# Patient Record
Sex: Male | Born: 1980 | Race: Black or African American | Hispanic: No | Marital: Married | Smoking: Never smoker
Health system: Southern US, Community
[De-identification: ages and names within clinical notes are randomized; demographics above are authoritative.]

## PROBLEM LIST (undated history)

## (undated) DIAGNOSIS — I82409 Acute embolism and thrombosis of unspecified deep veins of unspecified lower extremity: Secondary | ICD-10-CM

## (undated) HISTORY — DX: Acute embolism and thrombosis of unspecified deep veins of unspecified lower extremity: I82.409

---

## 2016-03-11 DIAGNOSIS — I82409 Acute embolism and thrombosis of unspecified deep veins of unspecified lower extremity: Secondary | ICD-10-CM

## 2016-03-11 HISTORY — DX: Acute embolism and thrombosis of unspecified deep veins of unspecified lower extremity: I82.409

## 2017-05-08 ENCOUNTER — Encounter: Payer: Self-pay | Admitting: Hematology & Oncology

## 2017-05-08 ENCOUNTER — Inpatient Hospital Stay (HOSPITAL_BASED_OUTPATIENT_CLINIC_OR_DEPARTMENT_OTHER): Payer: Medicaid Other | Admitting: Hematology & Oncology

## 2017-05-08 ENCOUNTER — Inpatient Hospital Stay: Payer: Medicaid Other | Attending: Hematology & Oncology

## 2017-05-08 VITALS — BP 122/88 | HR 78 | Temp 98.1°F | Wt 287.5 lb

## 2017-05-08 DIAGNOSIS — I82491 Acute embolism and thrombosis of other specified deep vein of right lower extremity: Secondary | ICD-10-CM

## 2017-05-08 DIAGNOSIS — Z7901 Long term (current) use of anticoagulants: Secondary | ICD-10-CM | POA: Insufficient documentation

## 2017-05-08 DIAGNOSIS — I2692 Saddle embolus of pulmonary artery without acute cor pulmonale: Secondary | ICD-10-CM

## 2017-05-08 LAB — CBC WITH DIFFERENTIAL (CANCER CENTER ONLY)
BASOS ABS: 0 10*3/uL (ref 0.0–0.1)
BASOS PCT: 0 %
EOS ABS: 0.1 10*3/uL (ref 0.0–0.5)
EOS PCT: 2 %
HCT: 44.3 % (ref 38.7–49.9)
Hemoglobin: 15.1 g/dL (ref 13.0–17.1)
Lymphocytes Relative: 55 %
Lymphs Abs: 2.5 10*3/uL (ref 0.9–3.3)
MCH: 28.6 pg (ref 28.0–33.4)
MCHC: 34.1 g/dL (ref 32.0–35.9)
MCV: 83.9 fL (ref 82.0–98.0)
Monocytes Absolute: 0.4 10*3/uL (ref 0.1–0.9)
Monocytes Relative: 9 %
NEUTROS PCT: 34 %
Neutro Abs: 1.6 10*3/uL (ref 1.5–6.5)
PLATELETS: 302 10*3/uL (ref 145–400)
RBC: 5.28 MIL/uL (ref 4.20–5.70)
RDW: 13.6 % (ref 11.1–15.7)
WBC: 4.6 10*3/uL (ref 4.0–10.0)

## 2017-05-08 LAB — CMP (CANCER CENTER ONLY)
ALT: 39 U/L (ref 0–55)
AST: 25 U/L (ref 5–34)
Albumin: 4.1 g/dL (ref 3.5–5.0)
Alkaline Phosphatase: 92 U/L (ref 40–150)
Anion gap: 10 (ref 3–11)
BUN: 14 mg/dL (ref 7–26)
CO2: 28 mmol/L (ref 22–29)
CREATININE: 1.3 mg/dL (ref 0.70–1.30)
Calcium: 10.2 mg/dL (ref 8.4–10.4)
Chloride: 102 mmol/L (ref 98–109)
Glucose, Bld: 83 mg/dL (ref 70–140)
Potassium: 4.6 mmol/L (ref 3.5–5.1)
Sodium: 140 mmol/L (ref 136–145)
TOTAL PROTEIN: 8.4 g/dL — AB (ref 6.4–8.3)
Total Bilirubin: 1.1 mg/dL (ref 0.2–1.2)
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
## 2017-05-09 ENCOUNTER — Telehealth: Payer: Self-pay | Admitting: Hematology & Oncology

## 2017-05-09 LAB — BETA-2-GLYCOPROTEIN I ABS, IGG/M/A: Beta-2 Glyco I IgG: <9 GPI IgG units (ref 0–20)

## 2017-05-09 LAB — HOMOCYSTEINE: HOMOCYSTEINE-NORM: 11.4 umol/L (ref 0.0–15.0)

## 2017-05-09 NOTE — Telephone Encounter (Signed)
sw pt to confirm 5/15 appt at 10 am for imaging and 1130 to see Dr Myna HidalgoEnnever

## 2017-05-10 LAB — CARDIOLIPIN ANTIBODIES, IGG, IGM, IGA
Anticardiolipin IgG: 12 GPL U/mL (ref 0–14)
Anticardiolipin IgM: <9 MPL U/mL (ref 0–12)

## 2017-05-10 LAB — PROTEIN S, TOTAL: Protein S Ag, Total: 86 % (ref 60–150)

## 2017-05-10 LAB — PROTEIN C ACTIVITY: PROTEIN C ACTIVITY: 173 % (ref 73–180)

## 2017-05-10 LAB — PROTEIN S ACTIVITY: Protein S Activity: 92 % (ref 63–140)

## 2017-05-11 LAB — PTT-LA MIX: PTT-LA Mix: 52.7 s — ABNORMAL HIGH (ref 0.0–48.9)

## 2017-05-11 LAB — DRVVT MIX: DRVVT MIX: 44.5 s (ref 0.0–47.0)

## 2017-05-11 LAB — LUPUS ANTICOAGULANT PANEL
DRVVT: 57.8 s — ABNORMAL HIGH (ref 0.0–47.0)
PTT LA: 57.6 s — AB (ref 0.0–51.9)

## 2017-05-11 LAB — HEXAGONAL PHASE PHOSPHOLIPID: Hexagonal Phase Phospholipid: 30 s — ABNORMAL HIGH (ref 0–11)

## 2017-05-12 LAB — PROTHROMBIN GENE MUTATION

## 2017-05-13 LAB — FACTOR 5 LEIDEN

## 2017-05-30 ENCOUNTER — Other Ambulatory Visit: Payer: Self-pay | Admitting: Internal Medicine

## 2017-05-30 ENCOUNTER — Ambulatory Visit
Admission: RE | Admit: 2017-05-30 | Discharge: 2017-05-30 | Disposition: A | Discharge status: Home / Self Care | Payer: Medicaid Other | Source: Ambulatory Visit | Attending: Internal Medicine | Admitting: Internal Medicine

## 2017-05-30 DIAGNOSIS — M545 Low back pain: Secondary | ICD-10-CM

## 2017-06-06 ENCOUNTER — Other Ambulatory Visit: Payer: Self-pay | Admitting: Internal Medicine

## 2017-06-06 DIAGNOSIS — M5432 Sciatica, left side: Secondary | ICD-10-CM

## 2017-06-10 ENCOUNTER — Ambulatory Visit
Admission: RE | Admit: 2017-06-10 | Discharge: 2017-06-10 | Disposition: A | Discharge status: Home / Self Care | Payer: Medicaid Other | Source: Ambulatory Visit | Attending: Internal Medicine | Admitting: Internal Medicine

## 2017-06-10 DIAGNOSIS — M5432 Sciatica, left side: Secondary | ICD-10-CM

## 2017-06-16 ENCOUNTER — Telehealth: Payer: Self-pay | Admitting: *Deleted

## 2017-06-16 ENCOUNTER — Inpatient Hospital Stay: Payer: Medicaid Other | Attending: Hematology & Oncology | Admitting: Hematology & Oncology

## 2017-06-16 ENCOUNTER — Inpatient Hospital Stay: Payer: Medicaid Other

## 2017-06-16 NOTE — Telephone Encounter (Signed)
Called patient to see why he missed his appointment today upon request from Dr. Myna HidalgoEnnever.  Patient stated that he has decided not to proceed with Neurosurgery at this time and would like for me to let Dr. Myna HidalgoEnnever know.

## 2017-07-23 ENCOUNTER — Encounter (HOSPITAL_BASED_OUTPATIENT_CLINIC_OR_DEPARTMENT_OTHER): Payer: Self-pay

## 2017-07-23 ENCOUNTER — Encounter: Payer: Self-pay | Admitting: Hematology & Oncology

## 2017-07-23 ENCOUNTER — Other Ambulatory Visit: Payer: Self-pay

## 2017-07-23 ENCOUNTER — Ambulatory Visit (HOSPITAL_BASED_OUTPATIENT_CLINIC_OR_DEPARTMENT_OTHER)
Admission: RE | Admit: 2017-07-23 | Discharge: 2017-07-23 | Disposition: A | Discharge status: Home / Self Care | Payer: Medicaid Other | Source: Ambulatory Visit | Attending: Hematology & Oncology | Admitting: Hematology & Oncology

## 2017-07-23 ENCOUNTER — Inpatient Hospital Stay (HOSPITAL_BASED_OUTPATIENT_CLINIC_OR_DEPARTMENT_OTHER): Payer: Medicaid Other | Admitting: Hematology & Oncology

## 2017-07-23 ENCOUNTER — Inpatient Hospital Stay: Payer: Medicaid Other | Attending: Hematology & Oncology

## 2017-07-23 VITALS — BP 128/89 | HR 65 | Temp 98.1°F | Resp 20 | Wt 285.0 lb

## 2017-07-23 DIAGNOSIS — Z7901 Long term (current) use of anticoagulants: Secondary | ICD-10-CM

## 2017-07-23 DIAGNOSIS — I82441 Acute embolism and thrombosis of right tibial vein: Secondary | ICD-10-CM | POA: Insufficient documentation

## 2017-07-23 DIAGNOSIS — I2692 Saddle embolus of pulmonary artery without acute cor pulmonale: Secondary | ICD-10-CM

## 2017-07-23 DIAGNOSIS — R918 Other nonspecific abnormal finding of lung field: Secondary | ICD-10-CM | POA: Insufficient documentation

## 2017-07-23 DIAGNOSIS — D6862 Lupus anticoagulant syndrome: Secondary | ICD-10-CM | POA: Insufficient documentation

## 2017-07-23 DIAGNOSIS — I2699 Other pulmonary embolism without acute cor pulmonale: Secondary | ICD-10-CM | POA: Diagnosis not present

## 2017-07-23 DIAGNOSIS — R937 Abnormal findings on diagnostic imaging of other parts of musculoskeletal system: Secondary | ICD-10-CM | POA: Insufficient documentation

## 2017-07-23 LAB — CMP (CANCER CENTER ONLY)
ALBUMIN: 4 g/dL (ref 3.5–5.0)
ALK PHOS: 81 U/L (ref 26–84)
ALT: 53 U/L — AB (ref 10–47)
AST: 34 U/L (ref 11–38)
Anion gap: 10 (ref 5–15)
BUN: 12 mg/dL (ref 7–22)
CALCIUM: 9.8 mg/dL (ref 8.0–10.3)
CO2: 29 mmol/L (ref 18–33)
CREATININE: 1.4 mg/dL — AB (ref 0.60–1.20)
Chloride: 103 mmol/L (ref 98–108)
Glucose, Bld: 89 mg/dL (ref 73–118)
Potassium: 4.1 mmol/L (ref 3.3–4.7)
SODIUM: 142 mmol/L (ref 128–145)
TOTAL PROTEIN: 8.2 g/dL — AB (ref 6.4–8.1)
Total Bilirubin: 1.1 mg/dL (ref 0.2–1.6)

## 2017-07-23 LAB — CBC WITH DIFFERENTIAL (CANCER CENTER ONLY)
BASOS ABS: 0 10*3/uL (ref 0.0–0.1)
Basophils Relative: 1 %
EOS ABS: 0.1 10*3/uL (ref 0.0–0.5)
EOS PCT: 2 %
HCT: 43.1 % (ref 38.7–49.9)
Hemoglobin: 14.7 g/dL (ref 13.0–17.1)
Lymphocytes Relative: 42 %
Lymphs Abs: 1.9 10*3/uL (ref 0.9–3.3)
MCH: 28.5 pg (ref 28.0–33.4)
MCHC: 34.1 g/dL (ref 32.0–35.9)
MCV: 83.7 fL (ref 82.0–98.0)
Monocytes Absolute: 0.4 10*3/uL (ref 0.1–0.9)
Monocytes Relative: 9 %
Neutro Abs: 2 10*3/uL (ref 1.5–6.5)
Neutrophils Relative %: 46 %
PLATELETS: 296 10*3/uL (ref 145–400)
RBC: 5.15 MIL/uL (ref 4.20–5.70)
RDW: 13.8 % (ref 11.1–15.7)
WBC: 4.4 10*3/uL (ref 4.0–10.0)

## 2017-07-23 MED ORDER — IOPAMIDOL (ISOVUE-370) INJECTION 76%
100.0000 mL | Freq: Once | INTRAVENOUS | Status: AC | PRN
  Administered 2017-07-23: 100 mL via INTRAVENOUS

## 2017-07-23 NOTE — Progress Notes (Signed)
Hematology and Oncology Follow Up Visit  Erik Kim [id number] Dec 15, 1980 37 y.o. 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 twice daily -started in June 2018     Interim History:  Erik Kim is back for follow-up.  We first saw him in late February.  At that time, he had been on Eliquis for quite a while.  He had his blood clots back in June 2018.  We did a hypercoagulable study.  Of course, the study showed a positive lupus anticoagulant.  I am not sure exactly what this signifies.  All of his other studies were negative.  He feels okay.  He is a Optician, dispensing.  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  The big news is that to his daughters might be chosen for the [employer] channel.  They have to go down to [employer] for [employer].  We did do a CT angiogram today.  This did not show any residual pulmonary embolism.  We did a Doppler of his right leg.  There is no thrombus noted in the lower leg.  He has had no bleeding.  He has had no nausea or vomiting.  He has had no headache.  He is gaining weight.  He is trying to lose weight.  Overall, his performance status is ECOG 0.   Medications:  Current Outpatient Medications:  .  apixaban (ELIQUIS) 5 MG TABS tablet, Take 5 mg by mouth 2 (two) times daily., Disp: , Rfl:   Allergies: No Known Allergies  Past Medical History, Surgical history, Social history, and Family History were reviewed and updated.  Review of Systems: Review of Systems  Constitutional: Negative.   HENT:  Negative.   Eyes: Negative.   Respiratory: Negative.   Cardiovascular: Negative.   Gastrointestinal: Negative.   Endocrine: Negative.   Genitourinary: Negative.    Musculoskeletal: Negative.   Skin: Negative.   Neurological: Negative.   Hematological: Negative.     Psychiatric/Behavioral: Negative.     Physical Exam:  weight is 285 lb (129.3 kg). His oral temperature is 98.1 F (36.7 C). His blood pressure is 128/89 and his pulse is 65. His respiration is 20 and oxygen saturation is 98%.   Wt Readings from Last 3 Encounters:  07/23/17 285 lb (129.3 kg)  05/08/17 287 lb 8 oz (130.4 kg)    Physical Exam  Constitutional: He is oriented to person, place, and time.  HENT:  Head: Normocephalic and atraumatic.  Mouth/Throat: Oropharynx is clear and moist.  Eyes: Pupils are equal, round, and reactive to light. EOM are normal.  Neck: Normal range of motion.  Cardiovascular: Normal rate, regular rhythm and normal heart sounds.  Pulmonary/Chest: Effort normal and breath sounds normal.  Abdominal: Soft. Bowel sounds are normal.  Musculoskeletal: Normal range of motion. He exhibits no edema, tenderness or deformity.  Lymphadenopathy:    He has no cervical adenopathy.  Neurological: He is alert and oriented to person, place, and time.  Skin: Skin is warm and dry. No rash noted. No erythema.  Psychiatric: He has a normal mood and affect. His behavior is normal. Judgment and thought content normal.  Vitals reviewed.    Lab Results  Component Value Date   WBC 4.4 07/23/2017   HGB 14.7 07/23/2017   HCT 43.1 07/23/2017   MCV 83.7 07/23/2017   PLT 296  07/23/2017     Chemistry      Component Value Date/Time   NA 142 07/23/2017 1119   K 4.1 07/23/2017 1119   CL 103 07/23/2017 1119   CO2 29 07/23/2017 1119   BUN 12 07/23/2017 1119   CREATININE 1.40 (H) 07/23/2017 1119      Component Value Date/Time   CALCIUM 9.8 07/23/2017 1119   ALKPHOS 81 07/23/2017 1119   AST 34 07/23/2017 1119   ALT 53 (H) 07/23/2017 1119   BILITOT 1.1 07/23/2017 1119      Impression and Plan: Erik Kim is a 37 year old African-American male.  He had a pulmonary embolism.  He had a thrombus in his right lower leg.  These have resolved.  He is on Eliquis.  He is on  full dose Eliquis.  I will continue him on the Eliquis for right now.  We will see what his follow-up lupus anticoagulant study shows.  I want to try to get him through the summertime.  He will be quite busy.  I will see him back in 4 months.  As always, it is a lot of fun talking with him.  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   Josph Macho, MD 5/15/20196:17 PM

## 2017-11-27 ENCOUNTER — Inpatient Hospital Stay: Payer: Medicaid Other

## 2017-11-27 ENCOUNTER — Inpatient Hospital Stay: Payer: Medicaid Other | Attending: Hematology & Oncology | Admitting: Hematology & Oncology

## 2017-11-27 ENCOUNTER — Encounter: Payer: Self-pay | Admitting: Hematology & Oncology

## 2017-11-27 ENCOUNTER — Other Ambulatory Visit: Payer: Self-pay

## 2017-11-27 VITALS — BP 120/75 | HR 80 | Temp 98.3°F | Resp 18 | Wt 279.0 lb

## 2017-11-27 DIAGNOSIS — Z7901 Long term (current) use of anticoagulants: Secondary | ICD-10-CM

## 2017-11-27 DIAGNOSIS — I2692 Saddle embolus of pulmonary artery without acute cor pulmonale: Secondary | ICD-10-CM

## 2017-11-27 DIAGNOSIS — Z86711 Personal history of pulmonary embolism: Secondary | ICD-10-CM | POA: Insufficient documentation

## 2017-11-27 DIAGNOSIS — D6862 Lupus anticoagulant syndrome: Secondary | ICD-10-CM | POA: Insufficient documentation

## 2017-11-27 DIAGNOSIS — Z86718 Personal history of other venous thrombosis and embolism: Secondary | ICD-10-CM | POA: Diagnosis not present

## 2017-11-27 LAB — CMP (CANCER CENTER ONLY)
ALT: 32 U/L (ref 0–44)
AST: 26 U/L (ref 15–41)
Albumin: 4.3 g/dL (ref 3.5–5.0)
Alkaline Phosphatase: 83 U/L (ref 38–126)
Anion gap: 8 (ref 5–15)
BUN: 12 mg/dL (ref 6–20)
CHLORIDE: 106 mmol/L (ref 98–111)
CO2: 26 mmol/L (ref 22–32)
CREATININE: 1.42 mg/dL — AB (ref 0.61–1.24)
Calcium: 10 mg/dL (ref 8.9–10.3)
Glucose, Bld: 111 mg/dL — ABNORMAL HIGH (ref 70–99)
POTASSIUM: 4.1 mmol/L (ref 3.5–5.1)
SODIUM: 140 mmol/L (ref 135–145)
Total Bilirubin: 0.8 mg/dL (ref 0.3–1.2)
Total Protein: 8.2 g/dL — ABNORMAL HIGH (ref 6.5–8.1)

## 2017-11-27 LAB — CBC WITH DIFFERENTIAL (CANCER CENTER ONLY)
BASOS ABS: 0 10*3/uL (ref 0.0–0.1)
Basophils Relative: 1 %
EOS PCT: 2 %
Eosinophils Absolute: 0.1 10*3/uL (ref 0.0–0.5)
HCT: 42.9 % (ref 38.7–49.9)
Hemoglobin: 14.4 g/dL (ref 13.0–17.1)
LYMPHS ABS: 2.5 10*3/uL (ref 0.9–3.3)
LYMPHS PCT: 55 %
MCH: 28.1 pg (ref 28.0–33.4)
MCHC: 33.6 g/dL (ref 32.0–35.9)
MCV: 83.6 fL (ref 82.0–98.0)
MONO ABS: 0.3 10*3/uL (ref 0.1–0.9)
Monocytes Relative: 6 %
NEUTROS ABS: 1.6 10*3/uL (ref 1.5–6.5)
Neutrophils Relative %: 36 %
PLATELETS: 307 10*3/uL (ref 145–400)
RBC: 5.13 MIL/uL (ref 4.20–5.70)
RDW: 13.5 % (ref 11.1–15.7)
WBC Count: 4.4 10*3/uL (ref 4.0–10.0)

## 2017-11-27 MED ORDER — APIXABAN 2.5 MG PO TABS: 2.5000 mg | ORAL_TABLET | Freq: Two times a day (BID) | ORAL | 12 refills | Status: DC

## 2017-11-27 NOTE — Progress Notes (Signed)
Hematology and Oncology Follow Up Visit  Erik Kim [id number] March 17, 1980 37 y.o. 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 twice daily -started in June 2018  Eliquis 2.5 po BID - maintanence -- start 11/2017     Interim History:  Erik Kim is back for follow-up.  It has been a while since we last saw him.  At the last time we saw him was back in May.  He has been busy this summer.  He is remodeling a house.  He is doing this in addition to [employer] in his church.  He is on Eliquis.  He is doing well on Eliquis.  We will switch him over to maintenance Eliquis.  I think this would be reasonable.  His first visit we found that he had a lupus anticoagulant.  We are checking this again.  If he has a positive lupus anticoagulant then we might have to consider low-dose aspirin.  His appetite is good.  He is trying to lose a little weight.  Overall, his performance status is ECOG 0.    Medications:  Current Outpatient Medications:  .  apixaban (ELIQUIS) 5 MG TABS tablet, Take 5 mg by mouth 2 (two) times daily., Disp: , Rfl:   Allergies: No Known Allergies  Past Medical History, Surgical history, Social history, and Family History were reviewed and updated.  Review of Systems: Review of Systems  Constitutional: Negative.   HENT:  Negative.   Eyes: Negative.   Respiratory: Negative.   Cardiovascular: Negative.   Gastrointestinal: Negative.   Endocrine: Negative.   Genitourinary: Negative.    Musculoskeletal: Negative.   Skin: Negative.   Neurological: Negative.   Hematological: Negative.   Psychiatric/Behavioral: Negative.     Physical Exam:  weight is 279 lb (126.6 kg). His oral temperature is 98.3 F (36.8 C). His blood pressure is 120/75 and his pulse is 80. His respiration is 18 and oxygen  saturation is 95%.   Wt Readings from Last 3 Encounters:  11/27/17 279 lb (126.6 kg)  07/23/17 285 lb (129.3 kg)  05/08/17 287 lb 8 oz (130.4 kg)    Physical Exam  Constitutional: He is oriented to person, place, and time.  HENT:  Head: Normocephalic and atraumatic.  Mouth/Throat: Oropharynx is clear and moist.  Eyes: Pupils are equal, round, and reactive to light. EOM are normal.  Neck: Normal range of motion.  Cardiovascular: Normal rate, regular rhythm and normal heart sounds.  Pulmonary/Chest: Effort normal and breath sounds normal.  Abdominal: Soft. Bowel sounds are normal.  Musculoskeletal: Normal range of motion. He exhibits no edema, tenderness or deformity.  Lymphadenopathy:    He has no cervical adenopathy.  Neurological: He is alert and oriented to person, place, and time.  Skin: Skin is warm and dry. No rash noted. No erythema.  Psychiatric: He has a normal mood and affect. His behavior is normal. Judgment and thought content normal.  Vitals reviewed.    Lab Results  Component Value Date   WBC 4.4 11/27/2017   HGB 14.4 11/27/2017   HCT 42.9 11/27/2017   MCV 83.6 11/27/2017   PLT 307 11/27/2017     Chemistry      Component Value Date/Time   NA 142 07/23/2017 1119   K 4.1 07/23/2017 1119   CL 103 07/23/2017 1119   CO2 29  07/23/2017 1119   BUN 12 07/23/2017 1119   CREATININE 1.40 (H) 07/23/2017 1119      Component Value Date/Time   CALCIUM 9.8 07/23/2017 1119   ALKPHOS 81 07/23/2017 1119   AST 34 07/23/2017 1119   ALT 53 (H) 07/23/2017 1119   BILITOT 1.1 07/23/2017 1119      Impression and Plan: Erik Kim is a 37 year old African-American male.  He had a pulmonary embolism.  He had a thrombus in his right lower leg.  These have resolved.  He is on Eliquis.  I would like to keep him on Eliquis for another year.  I want to do maintenance Eliquis at 2.5 mg a day.  He does understand this.  We will call in a new prescription for him.  Again, if he  does have a lupus anticoagulant, then we will have to see about getting him on aspirin.  I would like to see him back in 6 months.  Hopefully, all will be doing well in 6 months.  Josph Macho, MD 9/19/20192:56 PM

## 2017-11-29 LAB — LUPUS ANTICOAGULANT PANEL
DRVVT: 48.9 s — ABNORMAL HIGH (ref 0.0–47.0)
PTT Lupus Anticoagulant: 56 s — ABNORMAL HIGH (ref 0.0–51.9)

## 2017-11-29 LAB — HEXAGONAL PHASE PHOSPHOLIPID: Hexagonal Phase Phospholipid: 7 s (ref 0–11)

## 2017-11-29 LAB — DRVVT MIX: dRVVT Mix: 39.4 s (ref 0.0–47.0)

## 2017-11-29 LAB — PTT-LA MIX: PTT-LA MIX: 54.4 s — AB (ref 0.0–48.9)

## 2018-05-27 ENCOUNTER — Telehealth: Payer: Self-pay | Admitting: Hematology & Oncology

## 2018-05-27 NOTE — Telephone Encounter (Signed)
Spoke with patient and screened/ No symptoms °

## 2018-05-28 ENCOUNTER — Encounter: Payer: Self-pay | Admitting: Family

## 2018-05-28 ENCOUNTER — Inpatient Hospital Stay: Payer: Medicaid Other | Attending: Hematology & Oncology | Admitting: Family

## 2018-05-28 ENCOUNTER — Other Ambulatory Visit: Payer: Self-pay

## 2018-05-28 ENCOUNTER — Inpatient Hospital Stay: Payer: Medicaid Other

## 2018-05-28 ENCOUNTER — Telehealth: Payer: Self-pay | Admitting: Family

## 2018-05-28 VITALS — BP 129/84 | HR 72 | Temp 98.6°F | Resp 16 | Wt 269.0 lb

## 2018-05-28 DIAGNOSIS — D6862 Lupus anticoagulant syndrome: Secondary | ICD-10-CM | POA: Diagnosis not present

## 2018-05-28 DIAGNOSIS — D508 Other iron deficiency anemias: Secondary | ICD-10-CM

## 2018-05-28 DIAGNOSIS — R5383 Other fatigue: Secondary | ICD-10-CM | POA: Insufficient documentation

## 2018-05-28 DIAGNOSIS — I2692 Saddle embolus of pulmonary artery without acute cor pulmonale: Secondary | ICD-10-CM

## 2018-05-28 DIAGNOSIS — Z7901 Long term (current) use of anticoagulants: Secondary | ICD-10-CM | POA: Diagnosis not present

## 2018-05-28 DIAGNOSIS — R760 Raised antibody titer: Secondary | ICD-10-CM

## 2018-05-28 DIAGNOSIS — I2699 Other pulmonary embolism without acute cor pulmonale: Secondary | ICD-10-CM

## 2018-05-28 DIAGNOSIS — I82441 Acute embolism and thrombosis of right tibial vein: Secondary | ICD-10-CM | POA: Diagnosis not present

## 2018-05-28 LAB — CMP (CANCER CENTER ONLY)
ALBUMIN: 4.6 g/dL (ref 3.5–5.0)
ALK PHOS: 64 U/L (ref 38–126)
ALT: 21 U/L (ref 0–44)
AST: 22 U/L (ref 15–41)
Anion gap: 8 (ref 5–15)
BILIRUBIN TOTAL: 0.9 mg/dL (ref 0.3–1.2)
BUN: 17 mg/dL (ref 6–20)
CALCIUM: 9.6 mg/dL (ref 8.9–10.3)
CO2: 27 mmol/L (ref 22–32)
CREATININE: 1.25 mg/dL — AB (ref 0.61–1.24)
Chloride: 103 mmol/L (ref 98–111)
GFR, Est AFR Am: >60 mL/min (ref >60)
GFR, Estimated: >60 mL/min (ref >60)
GLUCOSE: 102 mg/dL — AB (ref 70–99)
Potassium: 4.2 mmol/L (ref 3.5–5.1)
SODIUM: 138 mmol/L (ref 135–145)
TOTAL PROTEIN: 7.7 g/dL (ref 6.5–8.1)

## 2018-05-28 LAB — CBC WITH DIFFERENTIAL (CANCER CENTER ONLY)
ABS IMMATURE GRANULOCYTES: 0 10*3/uL (ref 0.00–0.07)
BASOS ABS: 0 10*3/uL (ref 0.0–0.1)
Basophils Relative: 1 %
Eosinophils Absolute: 0.1 10*3/uL (ref 0.0–0.5)
Eosinophils Relative: 2 %
HEMATOCRIT: 42.7 % (ref 39.0–52.0)
HEMOGLOBIN: 14.4 g/dL (ref 13.0–17.0)
IMMATURE GRANULOCYTES: 0 %
LYMPHS ABS: 2.2 10*3/uL (ref 0.7–4.0)
LYMPHS PCT: 56 %
MCH: 28.5 pg (ref 26.0–34.0)
MCHC: 33.7 g/dL (ref 30.0–36.0)
MCV: 84.4 fL (ref 80.0–100.0)
Monocytes Absolute: 0.3 10*3/uL (ref 0.1–1.0)
Monocytes Relative: 7 %
NEUTROS ABS: 1.3 10*3/uL — AB (ref 1.7–7.7)
NRBC: 0 % (ref 0.0–0.2)
Neutrophils Relative %: 34 %
Platelet Count: 303 10*3/uL (ref 150–400)
RBC: 5.06 MIL/uL (ref 4.22–5.81)
RDW: 13 % (ref 11.5–15.5)
WBC Count: 3.9 10*3/uL — ABNORMAL LOW (ref 4.0–10.5)

## 2018-05-28 NOTE — Telephone Encounter (Signed)
Appointments scheduled letter/calendar mailed per 3/19 los °

## 2018-05-28 NOTE — Progress Notes (Signed)
Hematology and Oncology Follow Up Visit  Erik Kim [id number] [birth year] 38 y.o. 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  He is not in any visible distress at this time. VSS, O2 sat 100% on room air.  He is feeling fatigue and "off". He has changed his diet and has not been eating regularly. I encouraged him to eat 3 meals a day and have a healthy snack in between if needed. He may be experiencing episodes of hypoglycemia. His blood glucose at this time is 102.  No falls or syncopal episodes.  No episodes of bleeding, no bruising or petechiae.  No fever, chills, n/v, cough, rash, SOB, chest pain, palpitations, abdominal pain or changes in bowel or bladder habits.  No swelling, tenderness, numbness or tingling in his extremities.  He is staying hydrated and his weight is stable.   ECOG Performance Status: 1 - Symptomatic but completely ambulatory  Medications:  Allergies as of 05/28/2018   No Known Allergies     Medication List       Accurate as of May 28, 2018  2:11 PM. Always use your most recent med list.        apixaban 2.5 MG Tabs tablet Commonly known as:  Eliquis Take 1 tablet (2.5 mg total) by mouth 2 (two) times daily.       Allergies: No Known Allergies  Past Medical History, Surgical history, Social history, and Family History were reviewed and updated.  Review of Systems: All other 10 point review of systems is negative.   Physical Exam:  weight is 269 lb (122 kg). His oral temperature is 98.6 F (37 C). His blood pressure is 129/84 and his  pulse is 72. His respiration is 16 and oxygen saturation is 100%.   Wt Readings from Last 3 Encounters:  05/28/18 269 lb (122 kg)  11/27/17 279 lb (126.6 kg)  07/23/17 285 lb (129.3 kg)    Ocular: Sclerae unicteric, pupils equal, round and reactive to light Ear-nose-throat: Oropharynx clear, dentition fair Lymphatic: No cervical, supraclavicular or axillary adenopathy Lungs no rales or rhonchi, good excursion bilaterally Heart regular rate and rhythm, no murmur appreciated Abd soft, nontender, positive bowel sounds, no liver or spleen tip palpated on exam, no fluid wave  MSK no focal spinal tenderness, no joint edema Neuro: non-focal, well-oriented, appropriate affect Breasts: Deferred   Lab Results  Component Value Date   WBC 3.9 (L) 05/28/2018   HGB 14.4 05/28/2018   HCT 42.7 05/28/2018   MCV 84.4 05/28/2018   PLT 303 05/28/2018   No results found for: FERRITIN, IRON, TIBC, UIBC, IRONPCTSAT Lab Results  Component Value Date   RBC 5.06 05/28/2018   No results found for: KPAFRELGTCHN, LAMBDASER, KAPLAMBRATIO No results found for: IGGSERUM, IGA, IGMSERUM No results found for: TOTALPROTELP, ALBUMINELP, A1GS, A2GS, BETS, BETA2SER, GAMS, MSPIKE, SPEI   Chemistry      Component Value Date/Time   NA 138 05/28/2018 1310   K 4.2 05/28/2018 1310   CL 103 05/28/2018 1310   CO2 27 05/28/2018 1310   BUN 17 05/28/2018 1310   CREATININE 1.25 (  H) 05/28/2018 1310      Component Value Date/Time   CALCIUM 9.6 05/28/2018 1310   ALKPHOS 64 05/28/2018 1310   AST 22 05/28/2018 1310   ALT 21 05/28/2018 1310   BILITOT 0.9 05/28/2018 1310       Impression and Plan: Erik Kim is a very pleasant 38 yo African American gentleman with history of pulmonary embolism, right lower extremity DVT and positive lupus anticoagulant.  He is doing well on Eliquis and so far there has been no evidence of recurrence.  He is symptomatic as mentioned above with what sounds like possible iron deficiency.  He has not been eating well and is also at risk for iron deficienc  being on chronic anticoagulation.  We will see what his iron studies show and bring him back in for infusion or start oral supplement if needed.  We will go ahead and plan to see him back in another 6 months.  He will contact our office with any questions or concerns. We can certainly see him sooner if need be.   Emeline Gins, NP 3/19/20202:11 PM

## 2018-05-29 LAB — IRON AND TIBC
Iron: 99 ug/dL (ref 42–163)
Saturation Ratios: 32 % (ref 20–55)
TIBC: 304 ug/dL (ref 202–409)
UIBC: 205 ug/dL (ref 117–376)

## 2018-05-29 LAB — LUPUS ANTICOAGULANT PANEL
DRVVT: 40.8 s (ref 0.0–47.0)
PTT LA: 46.7 s (ref 0.0–51.9)

## 2018-05-29 LAB — FERRITIN: FERRITIN: 188 ng/mL (ref 24–336)

## 2018-11-27 ENCOUNTER — Encounter: Payer: Self-pay | Admitting: Hematology & Oncology

## 2018-11-27 ENCOUNTER — Other Ambulatory Visit: Payer: Self-pay

## 2018-11-27 ENCOUNTER — Inpatient Hospital Stay (HOSPITAL_BASED_OUTPATIENT_CLINIC_OR_DEPARTMENT_OTHER): Payer: Medicaid Other | Admitting: Hematology & Oncology

## 2018-11-27 ENCOUNTER — Inpatient Hospital Stay: Payer: Medicaid Other | Attending: Hematology & Oncology

## 2018-11-27 VITALS — BP 121/77 | HR 77 | Temp 98.2°F | Resp 18 | Wt 278.0 lb

## 2018-11-27 DIAGNOSIS — Z7901 Long term (current) use of anticoagulants: Secondary | ICD-10-CM | POA: Diagnosis not present

## 2018-11-27 DIAGNOSIS — I2699 Other pulmonary embolism without acute cor pulmonale: Secondary | ICD-10-CM | POA: Insufficient documentation

## 2018-11-27 DIAGNOSIS — R760 Raised antibody titer: Secondary | ICD-10-CM

## 2018-11-27 DIAGNOSIS — D508 Other iron deficiency anemias: Secondary | ICD-10-CM

## 2018-11-27 DIAGNOSIS — D6862 Lupus anticoagulant syndrome: Secondary | ICD-10-CM | POA: Insufficient documentation

## 2018-11-27 DIAGNOSIS — I2692 Saddle embolus of pulmonary artery without acute cor pulmonale: Secondary | ICD-10-CM

## 2018-11-27 LAB — CBC WITH DIFFERENTIAL (CANCER CENTER ONLY)
Abs Immature Granulocytes: 0.01 10*3/uL (ref 0.00–0.07)
Basophils Absolute: 0 10*3/uL (ref 0.0–0.1)
Basophils Relative: 0 %
Eosinophils Absolute: 0.2 10*3/uL (ref 0.0–0.5)
Eosinophils Relative: 3 %
HCT: 41.3 % (ref 39.0–52.0)
Hemoglobin: 13.8 g/dL (ref 13.0–17.0)
Immature Granulocytes: 0 %
Lymphocytes Relative: 50 %
Lymphs Abs: 2.2 10*3/uL (ref 0.7–4.0)
MCH: 28.6 pg (ref 26.0–34.0)
MCHC: 33.4 g/dL (ref 30.0–36.0)
MCV: 85.5 fL (ref 80.0–100.0)
Monocytes Absolute: 0.4 10*3/uL (ref 0.1–1.0)
Monocytes Relative: 9 %
Neutro Abs: 1.7 10*3/uL (ref 1.7–7.7)
Neutrophils Relative %: 38 %
Platelet Count: 310 10*3/uL (ref 150–400)
RBC: 4.83 MIL/uL (ref 4.22–5.81)
RDW: 13.2 % (ref 11.5–15.5)
WBC Count: 4.5 10*3/uL (ref 4.0–10.5)
nRBC: 0 % (ref 0.0–0.2)

## 2018-11-27 LAB — CMP (CANCER CENTER ONLY)
ALT: 23 U/L (ref 0–44)
AST: 22 U/L (ref 15–41)
Albumin: 4.3 g/dL (ref 3.5–5.0)
Alkaline Phosphatase: 68 U/L (ref 38–126)
Anion gap: 6 (ref 5–15)
BUN: 13 mg/dL (ref 6–20)
CO2: 29 mmol/L (ref 22–32)
Calcium: 9.9 mg/dL (ref 8.9–10.3)
Chloride: 106 mmol/L (ref 98–111)
Creatinine: 1.39 mg/dL — ABNORMAL HIGH (ref 0.61–1.24)
GFR, Est AFR Am: >60 mL/min (ref >60)
GFR, Estimated: >60 mL/min (ref >60)
Glucose, Bld: 74 mg/dL (ref 70–99)
Potassium: 4.2 mmol/L (ref 3.5–5.1)
Sodium: 141 mmol/L (ref 135–145)
Total Bilirubin: 0.6 mg/dL (ref 0.3–1.2)
Total Protein: 7.4 g/dL (ref 6.5–8.1)

## 2018-11-27 NOTE — Progress Notes (Signed)
Hematology and Oncology Follow Up Visit  Erik Kim [id number] 1981-01-27 38 y.o. 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  He feels pretty well.  He really has no specific complaints since we last saw him.  He is doing well on the Eliquis.  I told him that he is going be on Eliquis lifelong.  He is on low-dose.  I will think this will be a problem for him.  We did check a lupus anticoagulant on him last time he was here.  This was not found.  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  He really enjoys this.   He has had no problems with cough or shortness of breath.  There has been no chest wall pain.  He has had no leg pain or swelling.  He wants to try to lose some weight.  He is not able to exercise as much as he would like to.  I told him that he really needs to drink a lot of water.  Water will keep his blood flowing more freely.  He has had no bleeding.  There is been no change in bowel or bladder habits.  Overall, his performance status is ECOG 0.   Medications:  Allergies as of 11/27/2018   No Known Allergies     Medication List       Accurate as of November 27, 2018  4:09 PM. If you have any questions, ask your nurse or doctor.        apixaban 2.5 MG Tabs tablet Commonly known as: Eliquis Take 1 tablet (2.5 mg total) by mouth 2 (two) times daily.       Allergies: No Known Allergies  Past Medical History, Surgical history, Social history, and Family History were reviewed and updated.  Review of Systems: Review of Systems  Constitutional: Negative.   HENT: Negative.   Eyes: Negative.   Respiratory: Negative.   Cardiovascular: Negative.   Gastrointestinal: Negative.    Genitourinary: Negative.   Musculoskeletal: Negative.   Skin: Negative.   Neurological: Negative.   Endo/Heme/Allergies: Negative.   Psychiatric/Behavioral: Negative.       Physical Exam:  weight is 278 lb (126.1 kg). His temporal temperature is 98.2 F (36.8 C). His blood pressure is 121/77 and his pulse is 77. His respiration is 18 and oxygen saturation is 99%.   Wt Readings from Last 3 Encounters:  11/27/18 278 lb (126.1 kg)  05/28/18 269 lb (122 kg)  11/27/17 279 lb (126.6 kg)    Physical Exam Vitals signs reviewed.  HENT:     Head: Normocephalic and atraumatic.  Eyes:     Pupils: Pupils are equal, round, and reactive to light.  Neck:     Musculoskeletal: Normal range of motion.  Cardiovascular:     Rate and Rhythm: Normal rate and regular rhythm.     Heart sounds: Normal heart sounds.  Pulmonary:     Effort: Pulmonary effort is normal.     Breath sounds: Normal breath sounds.  Abdominal:     General: Bowel sounds are normal.     Palpations: Abdomen is soft.  Musculoskeletal: Normal range of motion.        General: No tenderness or deformity.  Lymphadenopathy:  Cervical: No cervical adenopathy.  Skin:    General: Skin is warm and dry.     Findings: No erythema or rash.  Neurological:     Mental Status: He is alert and oriented to person, place, and time.  Psychiatric:        Behavior: Behavior normal.        Thought Content: Thought content normal.        Judgment: Judgment normal.      Lab Results  Component Value Date   WBC 4.5 11/27/2018   HGB 13.8 11/27/2018   HCT 41.3 11/27/2018   MCV 85.5 11/27/2018   PLT 310 11/27/2018   Lab Results  Component Value Date   FERRITIN 188 05/28/2018   IRON 99 05/28/2018   TIBC 304 05/28/2018   UIBC 205 05/28/2018   IRONPCTSAT 32 05/28/2018   Lab Results  Component Value Date   RBC 4.83 11/27/2018   No results found for: KPAFRELGTCHN, LAMBDASER, KAPLAMBRATIO No results found for: IGGSERUM, IGA,  IGMSERUM No results found for: Marda StalkerOTALPROTELP, ALBUMINELP, A1GS, A2GS, BETS, BETA2SER, GAMS, MSPIKE, SPEI   Chemistry      Component Value Date/Time   NA 141 11/27/2018 1443   K 4.2 11/27/2018 1443   CL 106 11/27/2018 1443   CO2 29 11/27/2018 1443   BUN 13 11/27/2018 1443   CREATININE 1.39 (H) 11/27/2018 1443      Component Value Date/Time   CALCIUM 9.9 11/27/2018 1443   ALKPHOS 68 11/27/2018 1443   AST 22 11/27/2018 1443   ALT 23 11/27/2018 1443   BILITOT 0.6 11/27/2018 1443       Impression and Plan: Erik Kim is a very pleasant 38 yo African American gentleman with history of pulmonary embolism, right lower extremity DVT and a transiently positive lupus anticoagulant.   We will continue him on Eliquis.  Again, I do not see any reason why we need to stop this.  We probably have to follow-up with the lupus anticoagulant on occasion.  I will plan to get him back in 6 months.  I think this is very reasonable.  He knows that he can always come back to see us sooner if he has any issues.  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   Josph MachoPeter R Donyae Kohn, MD 9/18/20204:09 PM

## 2018-11-29 LAB — LUPUS ANTICOAGULANT PANEL
DRVVT: 40.6 s (ref 0.0–47.0)
PTT Lupus Anticoagulant: 45.2 s (ref 0.0–51.9)

## 2018-11-30 ENCOUNTER — Telehealth: Payer: Self-pay | Admitting: Hematology & Oncology

## 2018-11-30 LAB — FERRITIN: Ferritin: 161 ng/mL (ref 24–336)

## 2018-11-30 LAB — IRON AND TIBC
Iron: 75 ug/dL (ref 42–163)
Saturation Ratios: 26 % (ref 20–55)
TIBC: 294 ug/dL (ref 202–409)
UIBC: 219 ug/dL (ref 117–376)

## 2018-11-30 NOTE — Telephone Encounter (Signed)
Spoke with patient to confirm March appt per 9/18 LOS

## 2019-06-04 ENCOUNTER — Telehealth: Payer: Self-pay | Admitting: Family

## 2019-06-04 ENCOUNTER — Encounter: Payer: Self-pay | Admitting: Family

## 2019-06-04 ENCOUNTER — Inpatient Hospital Stay: Payer: Medicaid Other

## 2019-06-04 ENCOUNTER — Inpatient Hospital Stay: Payer: Medicaid Other | Attending: Hematology & Oncology | Admitting: Family

## 2019-06-04 ENCOUNTER — Other Ambulatory Visit: Payer: Self-pay

## 2019-06-04 VITALS — BP 128/75 | HR 88 | Temp 96.8°F | Resp 18 | Ht 74.0 in | Wt 288.0 lb

## 2019-06-04 DIAGNOSIS — R760 Raised antibody titer: Secondary | ICD-10-CM

## 2019-06-04 DIAGNOSIS — D6862 Lupus anticoagulant syndrome: Secondary | ICD-10-CM | POA: Insufficient documentation

## 2019-06-04 DIAGNOSIS — I2692 Saddle embolus of pulmonary artery without acute cor pulmonale: Secondary | ICD-10-CM | POA: Diagnosis not present

## 2019-06-04 DIAGNOSIS — D508 Other iron deficiency anemias: Secondary | ICD-10-CM | POA: Diagnosis not present

## 2019-06-04 DIAGNOSIS — Z86711 Personal history of pulmonary embolism: Secondary | ICD-10-CM | POA: Insufficient documentation

## 2019-06-04 DIAGNOSIS — Z7901 Long term (current) use of anticoagulants: Secondary | ICD-10-CM | POA: Diagnosis not present

## 2019-06-04 DIAGNOSIS — Z86718 Personal history of other venous thrombosis and embolism: Secondary | ICD-10-CM | POA: Diagnosis not present

## 2019-06-04 LAB — CMP (CANCER CENTER ONLY)
ALT: 21 U/L (ref 0–44)
AST: 22 U/L (ref 15–41)
Albumin: 4.4 g/dL (ref 3.5–5.0)
Alkaline Phosphatase: 76 U/L (ref 38–126)
Anion gap: 9 (ref 5–15)
BUN: 16 mg/dL (ref 6–20)
CO2: 25 mmol/L (ref 22–32)
Calcium: 9.6 mg/dL (ref 8.9–10.3)
Chloride: 107 mmol/L (ref 98–111)
Creatinine: 1.43 mg/dL — ABNORMAL HIGH (ref 0.61–1.24)
GFR, Est AFR Am: >60 mL/min (ref >60)
GFR, Estimated: >60 mL/min (ref >60)
Glucose, Bld: 114 mg/dL — ABNORMAL HIGH (ref 70–99)
Potassium: 4.1 mmol/L (ref 3.5–5.1)
Sodium: 141 mmol/L (ref 135–145)
Total Bilirubin: 0.7 mg/dL (ref 0.3–1.2)
Total Protein: 7.7 g/dL (ref 6.5–8.1)

## 2019-06-04 LAB — CBC WITH DIFFERENTIAL (CANCER CENTER ONLY)
Abs Immature Granulocytes: 0.01 10*3/uL (ref 0.00–0.07)
Basophils Absolute: 0 10*3/uL (ref 0.0–0.1)
Basophils Relative: 0 %
Eosinophils Absolute: 0.1 10*3/uL (ref 0.0–0.5)
Eosinophils Relative: 1 %
HCT: 42.6 % (ref 39.0–52.0)
Hemoglobin: 14.1 g/dL (ref 13.0–17.0)
Immature Granulocytes: 0 %
Lymphocytes Relative: 53 %
Lymphs Abs: 2.5 10*3/uL (ref 0.7–4.0)
MCH: 28.2 pg (ref 26.0–34.0)
MCHC: 33.1 g/dL (ref 30.0–36.0)
MCV: 85.2 fL (ref 80.0–100.0)
Monocytes Absolute: 0.4 10*3/uL (ref 0.1–1.0)
Monocytes Relative: 8 %
Neutro Abs: 1.8 10*3/uL (ref 1.7–7.7)
Neutrophils Relative %: 38 %
Platelet Count: 294 10*3/uL (ref 150–400)
RBC: 5 MIL/uL (ref 4.22–5.81)
RDW: 13.6 % (ref 11.5–15.5)
WBC Count: 4.7 10*3/uL (ref 4.0–10.5)
nRBC: 0 % (ref 0.0–0.2)

## 2019-06-04 LAB — D-DIMER, QUANTITATIVE (NOT AT ARMC): D-Dimer, Quant: 0.35 ug/mL-FEU (ref 0.00–0.50)

## 2019-06-04 MED ORDER — APIXABAN 2.5 MG PO TABS: 2.5000 mg | ORAL_TABLET | Freq: Two times a day (BID) | ORAL | 12 refills | Status: AC

## 2019-06-04 NOTE — Progress Notes (Signed)
Hematology and Oncology Follow Up Visit  Erik Kim [id number] 1981-02-04 39 y.o. 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 He is doing well and has no complaints at this time. He is excited that he and his wife are having a sweet baby boy in September. They already have 3 wonderful little girls.  He states that he is tolerating maintenance Eliquis nicely and is taking as prescribed.  No episodes of bleeding. No bruising or petechiae.  He has started working out again and was asking about testosterone supplementation. We recommend that he not use testosterone supplements as they can increase the risk of thrombus. He will try protein supplementation and see if it is helpful.  He pulled a muscle in his right side while dead lifting weights 3 weeks ago and states that this is improving.  No fever, chills, n/v, cough, rash, dizziness, SOB, chest pain, palpitations, abdominal pain or changes in bowel or bladder habits.  No swelling, tenderness, numbness or tingling in her extremities.  No falls or syncope.  He has been eating well and staying properly hydrated. His weight is stable.   ECOG Performance Status: 0 - Asymptomatic  Medications:  Allergies as of 06/04/2019   No Known Allergies     Medication List       Accurate as of June 04, 2019  2:33 PM. If you have any questions, ask your nurse or doctor.        apixaban 2.5 MG Tabs tablet Commonly known as: ELIQUIS Take 1 tablet (2.5 mg total) by mouth 2 (two) times daily.       Allergies: No Known Allergies  Past Medical History, Surgical history, Social history, and Family History were reviewed and updated.  Review of Systems: All other 10 point review of  systems is negative.   Physical Exam:  height is 6\' 2"  (1.88 m) and weight is 288 lb (130.6 kg). His temporal temperature is 96.8 F (36 C) (abnormal). His blood pressure is 128/75 and his pulse is 88. His respiration is 18 and oxygen saturation is 99%.   Wt Readings from Last 3 Encounters:  06/04/19 288 lb (130.6 kg)  11/27/18 278 lb (126.1 kg)  05/28/18 269 lb (122 kg)    Ocular: Sclerae unicteric, pupils equal, round and reactive to light Ear-nose-throat: Oropharynx clear, dentition fair Lymphatic: No cervical or supraclavicular adenopathy Lungs no rales or rhonchi, good excursion bilaterally Heart regular rate and rhythm, no murmur appreciated Abd soft, nontender, positive bowel sounds, no liver or spleen tip palpated on exam, no fluid wave  MSK no focal spinal tenderness, no joint edema Neuro: non-focal, well-oriented, appropriate affect Breasts: Deferred   Lab Results  Component Value Date   WBC 4.7 06/04/2019   HGB 14.1 06/04/2019   HCT 42.6 06/04/2019   MCV 85.2 06/04/2019   PLT 294 06/04/2019   Lab Results  Component Value Date   FERRITIN 161 11/27/2018   IRON 75 11/27/2018   TIBC 294 11/27/2018   UIBC 219 11/27/2018   IRONPCTSAT 26 11/27/2018   Lab Results  Component Value Date   RBC 5.00 06/04/2019   No results found for: KPAFRELGTCHN, LAMBDASER, KAPLAMBRATIO No results found for: IGGSERUM, IGA, IGMSERUM No results found for: TOTALPROTELP,  Nida Boatman, SPEI   Chemistry      Component Value Date/Time   NA 141 06/04/2019 1321   K 4.1 06/04/2019 1321   CL 107 06/04/2019 1321   CO2 25 06/04/2019 1321   BUN 16 06/04/2019 1321   CREATININE 1.43 (H) 06/04/2019 1321      Component Value Date/Time   CALCIUM 9.6 06/04/2019 1321   ALKPHOS 76 06/04/2019 1321   AST 22 06/04/2019 1321   ALT 21 06/04/2019 1321   BILITOT 0.7 06/04/2019 1321       Impression and Plan: Erik Kim is a pleasant 39 yo African American  gentleman with history of pulmonary embolism, right lower extremity DVT and a transiently positive lupus anticoagulant.  He is on lifelong maintenance anticoagulation with Eliquis 2.5 mg PO BID.  Prescription refilled today.  D-dimer is 0.35. Lupus anticoagulant testing is pending.  We will plan to see him back in another 6 months.  He will contact our office with any questions or concerns. We can certainly see him sooner if needed.   Emeline Gins, NP 3/26/20212:33 PM

## 2019-06-04 NOTE — Telephone Encounter (Signed)
Appointments scheduled calendar printed and MY Chart sign up was sent to him as well per 3/26 los

## 2019-06-07 LAB — DRVVT MIX: dRVVT Mix: 40.6 s — ABNORMAL HIGH (ref 0.0–40.4)

## 2019-06-07 LAB — PTT-LA MIX: PTT-LA Mix: 49.8 s — ABNORMAL HIGH (ref 0.0–48.9)

## 2019-06-07 LAB — HEXAGONAL PHASE PHOSPHOLIPID: Hexagonal Phase Phospholipid: 0 s (ref 0–11)

## 2019-06-07 LAB — DRVVT CONFIRM: dRVVT Confirm: 1.2 ratio (ref 0.8–1.2)

## 2019-06-07 LAB — LUPUS ANTICOAGULANT PANEL
DRVVT: 47.4 s — ABNORMAL HIGH (ref 0.0–47.0)
PTT Lupus Anticoagulant: 58.8 s — ABNORMAL HIGH (ref 0.0–51.9)

## 2019-09-21 ENCOUNTER — Telehealth: Payer: Self-pay | Admitting: Hematology & Oncology

## 2019-09-21 NOTE — Telephone Encounter (Signed)
Appointments rescheduled update calendar mailed to patient  

## 2019-12-03 ENCOUNTER — Other Ambulatory Visit: Payer: Medicaid Other

## 2019-12-03 ENCOUNTER — Ambulatory Visit: Payer: Medicaid Other | Admitting: Family

## 2019-12-06 ENCOUNTER — Inpatient Hospital Stay (HOSPITAL_BASED_OUTPATIENT_CLINIC_OR_DEPARTMENT_OTHER): Payer: Medicaid Other | Admitting: Family

## 2019-12-06 ENCOUNTER — Other Ambulatory Visit: Payer: Self-pay

## 2019-12-06 ENCOUNTER — Inpatient Hospital Stay: Payer: Medicaid Other | Attending: Hematology & Oncology

## 2019-12-06 ENCOUNTER — Encounter: Payer: Self-pay | Admitting: Family

## 2019-12-06 VITALS — BP 116/77 | HR 69 | Temp 98.5°F | Resp 18 | Ht 74.0 in | Wt 266.8 lb

## 2019-12-06 DIAGNOSIS — R760 Raised antibody titer: Secondary | ICD-10-CM

## 2019-12-06 DIAGNOSIS — Z86718 Personal history of other venous thrombosis and embolism: Secondary | ICD-10-CM | POA: Diagnosis not present

## 2019-12-06 DIAGNOSIS — Z7901 Long term (current) use of anticoagulants: Secondary | ICD-10-CM | POA: Insufficient documentation

## 2019-12-06 DIAGNOSIS — Z86711 Personal history of pulmonary embolism: Secondary | ICD-10-CM | POA: Insufficient documentation

## 2019-12-06 DIAGNOSIS — I2692 Saddle embolus of pulmonary artery without acute cor pulmonale: Secondary | ICD-10-CM

## 2019-12-06 DIAGNOSIS — D6862 Lupus anticoagulant syndrome: Secondary | ICD-10-CM | POA: Insufficient documentation

## 2019-12-06 DIAGNOSIS — D508 Other iron deficiency anemias: Secondary | ICD-10-CM

## 2019-12-06 LAB — CMP (CANCER CENTER ONLY)
ALT: 24 U/L (ref 0–44)
AST: 22 U/L (ref 15–41)
Albumin: 4.6 g/dL (ref 3.5–5.0)
Alkaline Phosphatase: 70 U/L (ref 38–126)
Anion gap: 8 (ref 5–15)
BUN: 14 mg/dL (ref 6–20)
CO2: 26 mmol/L (ref 22–32)
Calcium: 10.7 mg/dL — ABNORMAL HIGH (ref 8.9–10.3)
Chloride: 104 mmol/L (ref 98–111)
Creatinine: 1.29 mg/dL — ABNORMAL HIGH (ref 0.61–1.24)
GFR, Est AFR Am: >60 mL/min (ref >60)
GFR, Estimated: >60 mL/min (ref >60)
Glucose, Bld: 98 mg/dL (ref 70–99)
Potassium: 4.1 mmol/L (ref 3.5–5.1)
Sodium: 138 mmol/L (ref 135–145)
Total Bilirubin: 0.6 mg/dL (ref 0.3–1.2)
Total Protein: 8 g/dL (ref 6.5–8.1)

## 2019-12-06 LAB — CBC WITH DIFFERENTIAL (CANCER CENTER ONLY)
Abs Immature Granulocytes: 0.01 10*3/uL (ref 0.00–0.07)
Basophils Absolute: 0 10*3/uL (ref 0.0–0.1)
Basophils Relative: 0 %
Eosinophils Absolute: 0.1 10*3/uL (ref 0.0–0.5)
Eosinophils Relative: 3 %
HCT: 41.6 % (ref 39.0–52.0)
Hemoglobin: 13.9 g/dL (ref 13.0–17.0)
Immature Granulocytes: 0 %
Lymphocytes Relative: 55 %
Lymphs Abs: 2.1 10*3/uL (ref 0.7–4.0)
MCH: 27.9 pg (ref 26.0–34.0)
MCHC: 33.4 g/dL (ref 30.0–36.0)
MCV: 83.4 fL (ref 80.0–100.0)
Monocytes Absolute: 0.3 10*3/uL (ref 0.1–1.0)
Monocytes Relative: 8 %
Neutro Abs: 1.3 10*3/uL — ABNORMAL LOW (ref 1.7–7.7)
Neutrophils Relative %: 34 %
Platelet Count: 283 10*3/uL (ref 150–400)
RBC: 4.99 MIL/uL (ref 4.22–5.81)
RDW: 13 % (ref 11.5–15.5)
WBC Count: 3.8 10*3/uL — ABNORMAL LOW (ref 4.0–10.5)
nRBC: 0 % (ref 0.0–0.2)

## 2019-12-06 LAB — D-DIMER, QUANTITATIVE: D-Dimer, Quant: 0.5 ug/mL-FEU (ref 0.00–0.50)

## 2019-12-06 NOTE — Progress Notes (Signed)
Hematology and Oncology Follow Up Visit  Erik Kim [id number] 10-31-1980 39 y.o. 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 He is doing well and has no complaints at this time.  He states that his SOB and chest tightness with activity has resolved. He continues to work out regularly.  He is doing well on maintenance Eliquis and has not had any episodes of bleeding. No bruising or petechiae.  No fever, chill, n/v, cough, rash, dizziness, SOB, chest pain, palpitations, abdominal pain or changes in bowel or bladder habits.  No swelling, tenderness, numbness or tingling in his extremities at this time.  No falls or syncope.  He has maintained a good appetite and is staying well hydrated. Her weight is stable.   ECOG Performance Status: 0 - Asymptomatic  Medications:  Allergies as of 12/06/2019   No Known Allergies     Medication List       Accurate as of December 06, 2019  1:59 PM. If you have any questions, ask your nurse or doctor.        apixaban 2.5 MG Tabs tablet Commonly known as: ELIQUIS Take 1 tablet (2.5 mg total) by mouth 2 (two) times daily.       Allergies: No Known Allergies  Past Medical History, Surgical history, Social history, and Family History were reviewed and updated.  Review of Systems: All other 10 point review of systems is negative.   Physical Exam:  height is 6\' 2"  (1.88 m) and weight is 266 lb 12.8 oz (121 kg). His oral temperature is 98.5 F (36.9 C). His blood pressure is 116/77 and his pulse is 69. His respiration is 18 and oxygen saturation is 99%.   Wt Readings from Last 3 Encounters:  12/06/19 266 lb 12.8 oz (121 kg)  06/04/19 288 lb (130.6 kg)  11/27/18 278 lb  (126.1 kg)    Ocular: Sclerae unicteric, pupils equal, round and reactive to light Ear-nose-throat: Oropharynx clear, dentition fair Lymphatic: No cervical or supraclavicular adenopathy Lungs no rales or rhonchi, good excursion bilaterally Heart regular rate and rhythm, no murmur appreciated Abd soft, nontender, positive bowel sounds MSK no focal spinal tenderness, no joint edema Neuro: non-focal, well-oriented, appropriate affect Breasts: Deferred   Lab Results  Component Value Date   WBC 3.8 (L) 12/06/2019   HGB 13.9 12/06/2019   HCT 41.6 12/06/2019   MCV 83.4 12/06/2019   PLT 283 12/06/2019   Lab Results  Component Value Date   FERRITIN 161 11/27/2018   IRON 75 11/27/2018   TIBC 294 11/27/2018   UIBC 219 11/27/2018   IRONPCTSAT 26 11/27/2018   Lab Results  Component Value Date   RBC 4.99 12/06/2019   No results found for: KPAFRELGTCHN, LAMBDASER, KAPLAMBRATIO No results found for: IGGSERUM, IGA, IGMSERUM No results found for: 12/08/2019, A1GS, A2GS, Erik Kim, MSPIKE, SPEI   Chemistry      Component Value Date/Time   NA 138 12/06/2019 1320   K 4.1 12/06/2019 1320   CL 104 12/06/2019 1320   CO2 26 12/06/2019 1320   BUN 14 12/06/2019 1320   CREATININE 1.29 (H) 12/06/2019 1320      Component Value Date/Time   CALCIUM 10.7 (H) 12/06/2019 1320   ALKPHOS 70 12/06/2019 1320  AST 22 12/06/2019 1320   ALT 24 12/06/2019 1320   BILITOT 0.6 12/06/2019 1320       Impression and Plan: Erik Kim is a pleasant 39 yo African American gentleman with history of pulmonary embolism, right lower extremity DVT anda transientlypositive lupus anticoagulant.  D-dimer is 0.50. Lupus anticoagulant was not detected.  He completed a year of full dose anticoagulation in September 2020 with Eliquis and now has completed a year of maintenance. I spoke with Dr. Myna Hidalgo and it is ok for his to stop his anticoagulant.  Follow-up in 6 months.  He can contact our  office with any questions or concerns. We can certainly see him sooner if needed.   Erik Gins, NP 9/27/20211:59 PM

## 2019-12-07 ENCOUNTER — Telehealth: Payer: Self-pay | Admitting: Family

## 2019-12-07 LAB — LUPUS ANTICOAGULANT PANEL
DRVVT: 29.1 s (ref 0.0–47.0)
PTT Lupus Anticoagulant: 37.4 s (ref 0.0–51.9)

## 2019-12-07 LAB — IRON AND TIBC
Iron: 67 ug/dL (ref 42–163)
Saturation Ratios: 22 % (ref 20–55)
TIBC: 308 ug/dL (ref 202–409)
UIBC: 240 ug/dL (ref 117–376)

## 2019-12-07 LAB — FERRITIN: Ferritin: 247 ng/mL (ref 24–336)

## 2019-12-07 NOTE — Telephone Encounter (Signed)
Appointments scheduled calendar printed & mailed. My Chart Access was sent to him as well/ per 9/27 los

## 2019-12-10 ENCOUNTER — Telehealth: Payer: Self-pay | Admitting: *Deleted

## 2019-12-10 NOTE — Telephone Encounter (Signed)
As noted below by Emeline Gins, NP, I informed the patient that he can stop Eliquis. Also, no lupus anticoagulant was noted on his blood work. We will see you back in six months. He verbalized understanding.

## 2019-12-10 NOTE — Telephone Encounter (Signed)
-----   Message from Verdie Mosher, NP sent at 12/10/2019  2:11 PM EDT ----- He can stop his Eliquis!!!!!! WOO HOO!!!!!!! Also, no lupus anticoagulant was noted on his blood work which is awesome! We will see him again in 6 months.   Sarah   ----- Message ----- From: Leory Plowman, [facility] [place] Sent: 12/06/2019   1:31 PM EDT To: Verdie Mosher, NP

## 2020-01-21 IMAGING — CT CT ANGIO CHEST
2 of 8 series · 18 of 36 positions shown · IV contrast (iopamidol)
Comparison: None.

CLINICAL DATA: History of known pulmonary emboli, evaluate for
residual emboli, the patient is on Eliquis

EXAM:
CT ANGIOGRAPHY CHEST WITH CONTRAST
TECHNIQUE: Multidetector CT imaging of the chest was performed using the
standard protocol during bolus administration of intravenous
contrast. Multiplanar CT image reconstructions and MIPs were
obtained to evaluate the vascular anatomy.
CONTRAST:  100mL DOS68X-YAR IOPAMIDOL (DOS68X-YAR) INJECTION 76%

[Series 6: pe coronal mpr · coronal · 0.55mm/px · 1 of 151 slices shown]
[im 76/151  mediastinal]
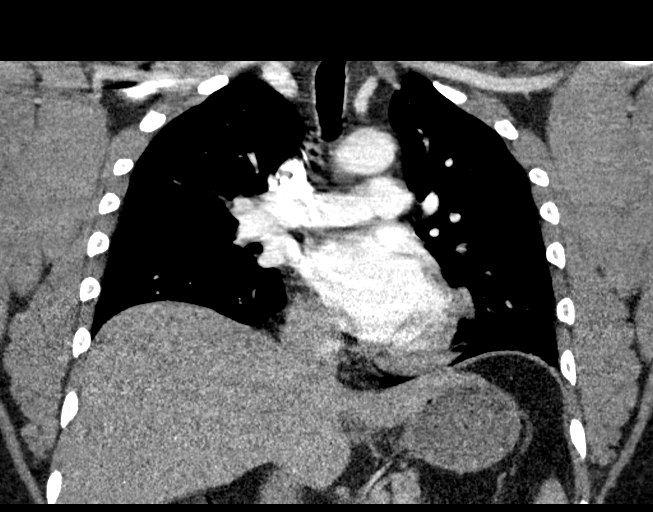

[Series 10: pe thins · axial · 0.80mm/px · z∈[-288,-45]mm · 17 of 273 slices shown]
[im 15/273  lung]
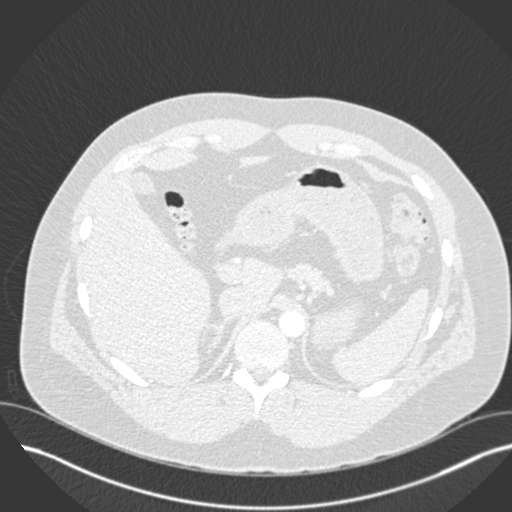
[im 29/273  mediastinal]
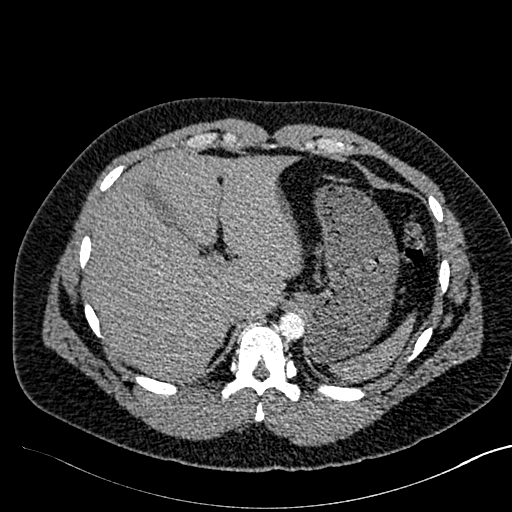
[im 43/273  lung]
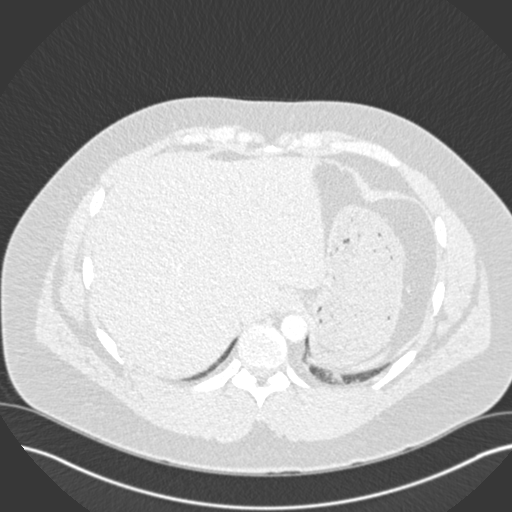
[im 58/273  mediastinal]
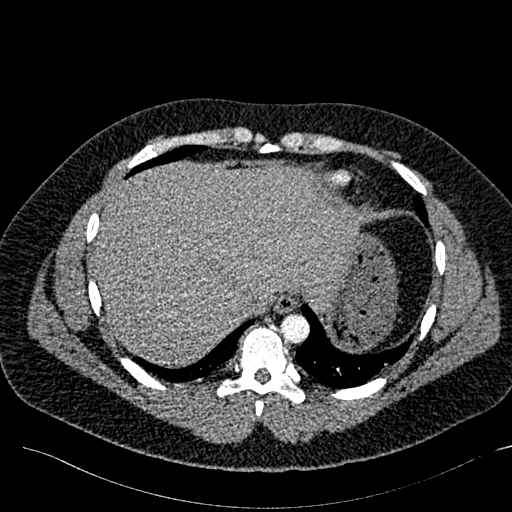
[im 72/273  lung]
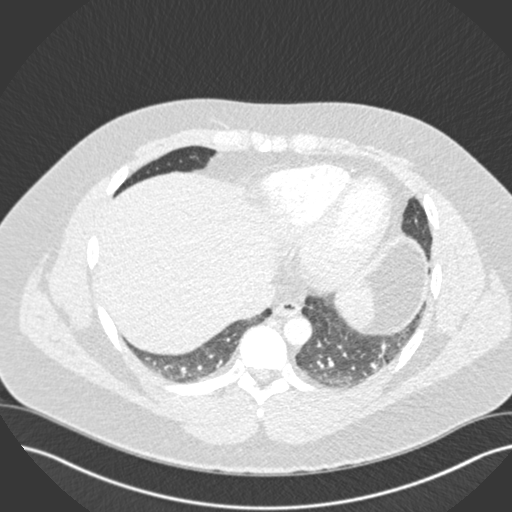
[im 86/273  mediastinal]
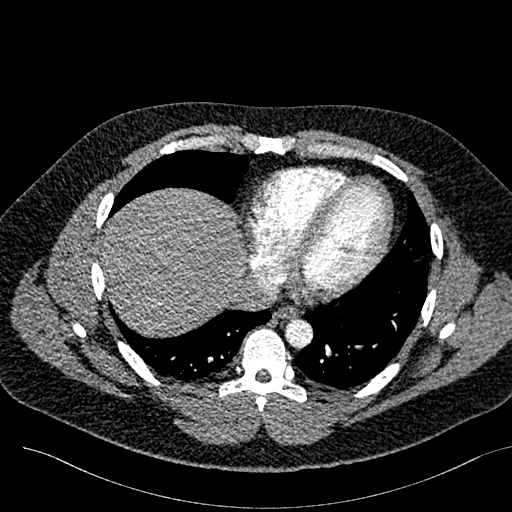
[im 101/273  lung]
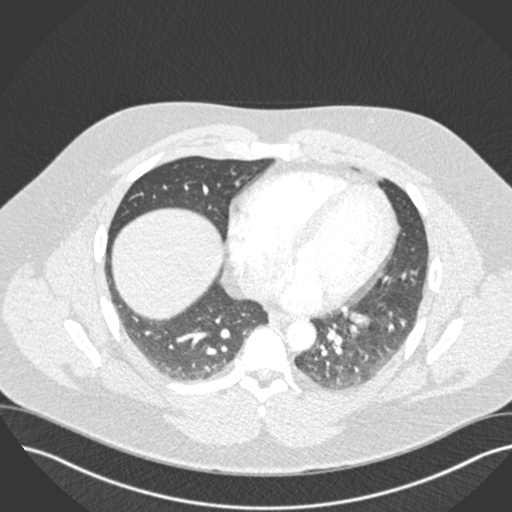
[im 115/273  mediastinal]
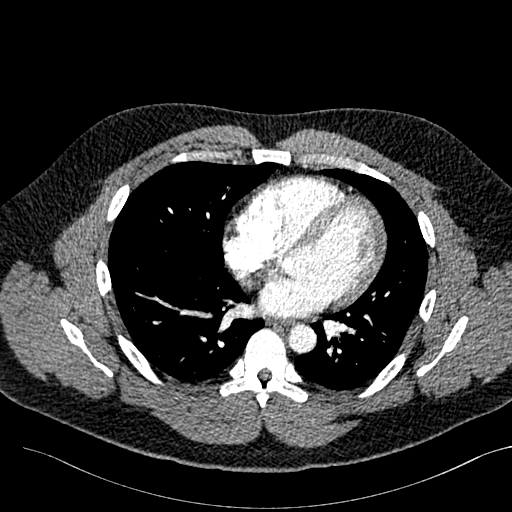
[im 144/273  lung]
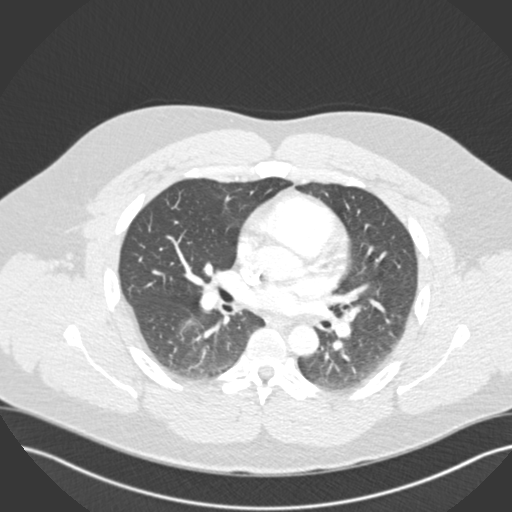
[im 158/273  mediastinal]
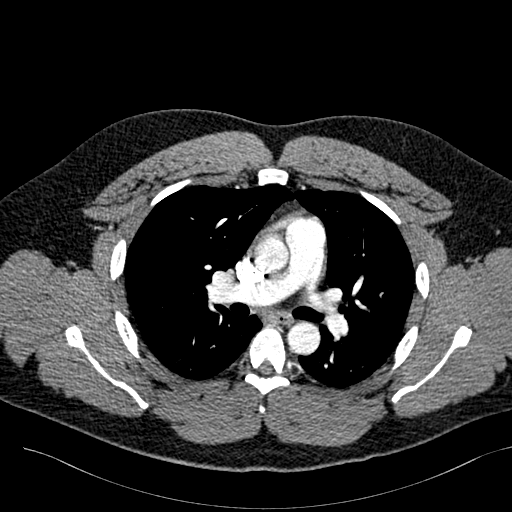
[im 172/273  lung]
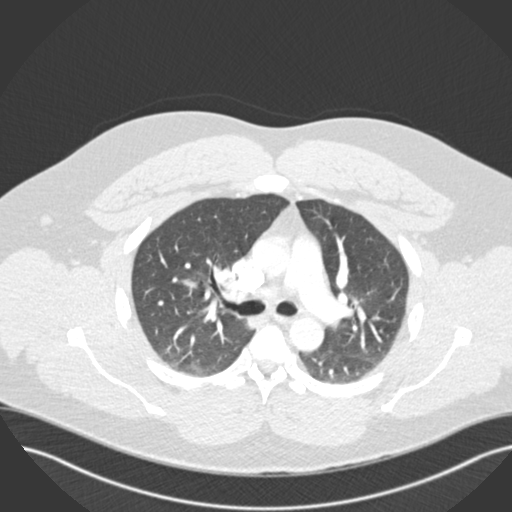
[im 187/273  mediastinal]
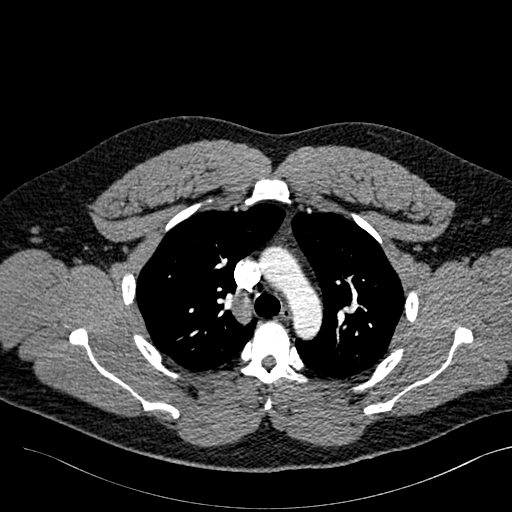
[im 201/273  lung]
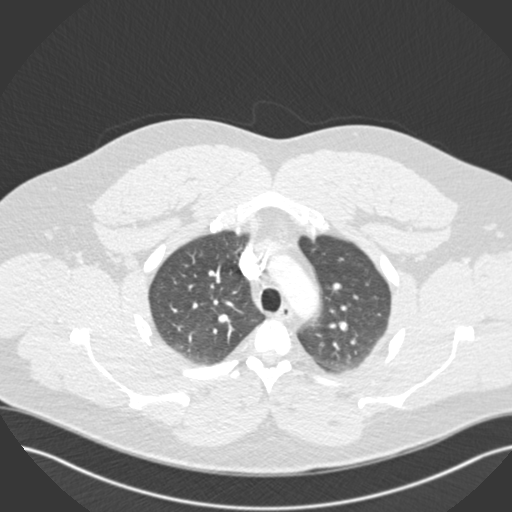
[im 215/273  mediastinal]
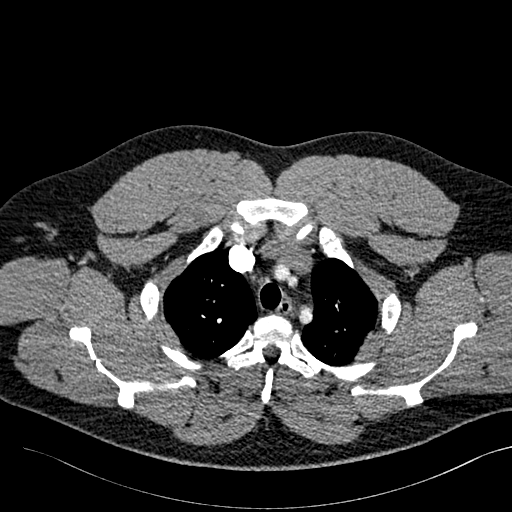
[im 230/273  lung]
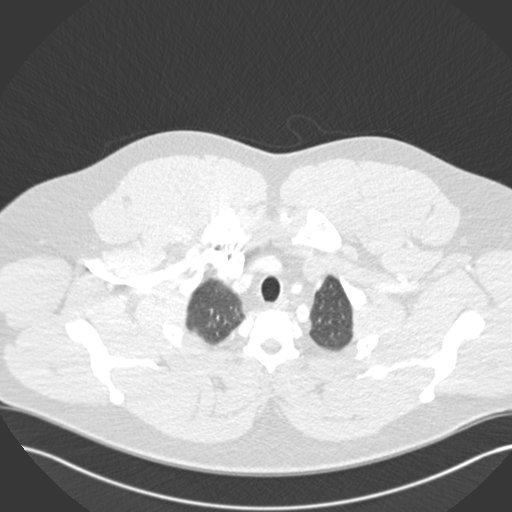
[im 244/273  mediastinal]
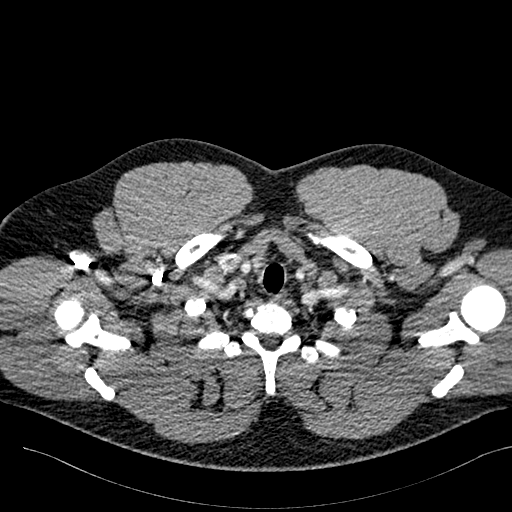
[im 258/273  lung]
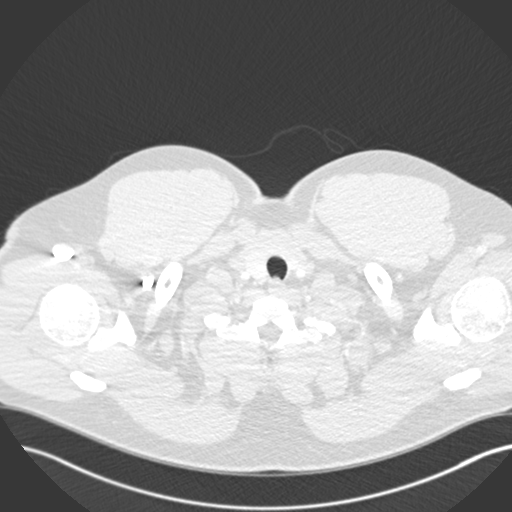

[18 of 36 positions shown; findings below may reference images not displayed]

FINDINGS: Cardiovascular: There is good opacification of the of the pulmonary
arteries. No central pulmonary embolus is seen. The only
questionable abnormality is perhaps a tiny filling defect in a
branch of the left pulmonary artery to the posteromedial left lower
lobe as seen on axial image 53 series 4 and coronal image 101 series
6. Also there is some motion present and this small questioned
embolus is not a definite finding. No definite right sided pulmonary
embolus is seen. The heart is within normal limits in size. No
coronary artery calcifications are noted.

Mediastinum/Nodes: No mediastinal or hilar adenopathy is seen. The
thyroid gland is somewhat asymmetric right side larger than left but
no low-attenuation nodules are seen. No hiatal hernia is noted.

Lungs/Pleura: On lung window images, no lung infiltrate is seen.
There is no evidence of pleural effusion. Linear atelectasis or
scarring is noted primarily in the left lower lobe. Minimal haziness
is present in the superior segment of the right lower lobe which may
be due to volume loss and/or scarring. The central airway is patent.

Upper Abdomen: There may be a small cyst or hemangioma posterior
medially in the right lobe of liver of only 8 mm in diameter. No
other hepatic abnormality is noted. No gallstones are seen.

Musculoskeletal: The thoracic vertebrae are in normal alignment with
no abnormality noted. Intervertebral disc spaces appear normal.

Review of the MIP images confirms the above findings.
IMPRESSION: 1. No definite pulmonary embolism is seen. There is only a
questionable tiny filling defect within a branch of the left
pulmonary artery to the left lower lobe of doubtful significance.
2. No mediastinal or hilar adenopathy.
3. Minimal haziness in the superior segment of the right lower lobe
may be due to volume loss and/or scarring. No parenchymal infiltrate
or pleural effusion is seen.

## 2020-06-05 ENCOUNTER — Other Ambulatory Visit: Payer: Medicaid Other

## 2020-06-05 ENCOUNTER — Ambulatory Visit: Payer: Medicaid Other | Admitting: Hematology & Oncology
